# Patient Record
Sex: Male | Born: 1940 | Race: White | Hispanic: No | Marital: Married | Smoking: Never smoker
Health system: Southern US, Community
[De-identification: ages and names within clinical notes are randomized; demographics above are authoritative.]

## PROBLEM LIST (undated history)

## (undated) DIAGNOSIS — E7800 Pure hypercholesterolemia, unspecified: Secondary | ICD-10-CM

## (undated) DIAGNOSIS — I2699 Other pulmonary embolism without acute cor pulmonale: Secondary | ICD-10-CM

## (undated) DIAGNOSIS — C762 Malignant neoplasm of abdomen: Secondary | ICD-10-CM

## (undated) DIAGNOSIS — N529 Male erectile dysfunction, unspecified: Secondary | ICD-10-CM

## (undated) DIAGNOSIS — K8020 Calculus of gallbladder without cholecystitis without obstruction: Secondary | ICD-10-CM

## (undated) DIAGNOSIS — M109 Gout, unspecified: Secondary | ICD-10-CM

## (undated) DIAGNOSIS — F419 Anxiety disorder, unspecified: Secondary | ICD-10-CM

## (undated) DIAGNOSIS — Z808 Family history of malignant neoplasm of other organs or systems: Secondary | ICD-10-CM

## (undated) HISTORY — PX: ILEOSTOMY: SHX1783

---

## 2009-10-10 ENCOUNTER — Ambulatory Visit (HOSPITAL_COMMUNITY): Admission: RE | Admit: 2009-10-10 | Discharge: 2009-10-10 | Payer: Self-pay | Admitting: Endocrinology

## 2009-10-17 ENCOUNTER — Ambulatory Visit (HOSPITAL_COMMUNITY): Admission: RE | Admit: 2009-10-17 | Discharge: 2009-10-17 | Payer: Self-pay | Admitting: Endocrinology

## 2010-08-23 DIAGNOSIS — I2699 Other pulmonary embolism without acute cor pulmonale: Secondary | ICD-10-CM

## 2010-08-23 HISTORY — DX: Other pulmonary embolism without acute cor pulmonale: I26.99

## 2010-09-13 ENCOUNTER — Encounter: Payer: Self-pay | Admitting: Endocrinology

## 2010-11-12 LAB — CBC
HCT: 46.3 % (ref 39.0–52.0)
MCHC: 34.2 g/dL (ref 30.0–36.0)
Platelets: 201 10*3/uL (ref 150–400)
WBC: 6.3 10*3/uL (ref 4.0–10.5)

## 2012-11-26 ENCOUNTER — Encounter (HOSPITAL_COMMUNITY): Payer: Self-pay | Admitting: Physical Medicine and Rehabilitation

## 2012-11-26 ENCOUNTER — Emergency Department (HOSPITAL_COMMUNITY)
Admission: EM | Admit: 2012-11-26 | Discharge: 2012-11-26 | Disposition: A | Discharge status: Home / Self Care | Payer: Medicare Other | Attending: Emergency Medicine | Admitting: Emergency Medicine

## 2012-11-26 DIAGNOSIS — Z86711 Personal history of pulmonary embolism: Secondary | ICD-10-CM | POA: Insufficient documentation

## 2012-11-26 DIAGNOSIS — Z79899 Other long term (current) drug therapy: Secondary | ICD-10-CM | POA: Insufficient documentation

## 2012-11-26 DIAGNOSIS — F411 Generalized anxiety disorder: Secondary | ICD-10-CM | POA: Insufficient documentation

## 2012-11-26 DIAGNOSIS — C482 Malignant neoplasm of peritoneum, unspecified: Secondary | ICD-10-CM | POA: Insufficient documentation

## 2012-11-26 DIAGNOSIS — M109 Gout, unspecified: Secondary | ICD-10-CM | POA: Insufficient documentation

## 2012-11-26 DIAGNOSIS — H538 Other visual disturbances: Secondary | ICD-10-CM | POA: Insufficient documentation

## 2012-11-26 DIAGNOSIS — R61 Generalized hyperhidrosis: Secondary | ICD-10-CM | POA: Insufficient documentation

## 2012-11-26 DIAGNOSIS — IMO0002 Reserved for concepts with insufficient information to code with codable children: Secondary | ICD-10-CM | POA: Insufficient documentation

## 2012-11-26 DIAGNOSIS — E7800 Pure hypercholesterolemia, unspecified: Secondary | ICD-10-CM | POA: Insufficient documentation

## 2012-11-26 DIAGNOSIS — Z9221 Personal history of antineoplastic chemotherapy: Secondary | ICD-10-CM | POA: Insufficient documentation

## 2012-11-26 DIAGNOSIS — Z87448 Personal history of other diseases of urinary system: Secondary | ICD-10-CM | POA: Insufficient documentation

## 2012-11-26 DIAGNOSIS — R42 Dizziness and giddiness: Secondary | ICD-10-CM

## 2012-11-26 DIAGNOSIS — Z7901 Long term (current) use of anticoagulants: Secondary | ICD-10-CM | POA: Insufficient documentation

## 2012-11-26 HISTORY — DX: Other pulmonary embolism without acute cor pulmonale: I26.99

## 2012-11-26 HISTORY — DX: Anxiety disorder, unspecified: F41.9

## 2012-11-26 HISTORY — DX: Gout, unspecified: M10.9

## 2012-11-26 HISTORY — DX: Male erectile dysfunction, unspecified: N52.9

## 2012-11-26 HISTORY — DX: Malignant neoplasm of abdomen: C76.2

## 2012-11-26 HISTORY — DX: Pure hypercholesterolemia, unspecified: E78.00

## 2012-11-26 LAB — CBC
MCH: 31.7 pg (ref 26.0–34.0)
MCHC: 34.6 g/dL (ref 30.0–36.0)
MCV: 91.6 fL (ref 78.0–100.0)
RBC: 4.54 MIL/uL (ref 4.22–5.81)
RDW: 15.8 % — ABNORMAL HIGH (ref 11.5–15.5)

## 2012-11-26 LAB — BASIC METABOLIC PANEL
BUN: 24 mg/dL — ABNORMAL HIGH (ref 6–23)
Calcium: 9.5 mg/dL (ref 8.4–10.5)
GFR calc Af Amer: 76 mL/min — ABNORMAL LOW (ref >90)
Glucose, Bld: 103 mg/dL — ABNORMAL HIGH (ref 70–99)
Sodium: 138 mEq/L (ref 135–145)

## 2012-11-26 LAB — GLUCOSE, CAPILLARY: Glucose-Capillary: 95 mg/dL (ref 70–99)

## 2012-11-26 LAB — PROTIME-INR: Prothrombin Time: 24.7 seconds — ABNORMAL HIGH (ref 11.6–15.2)

## 2012-11-26 MED ORDER — HEPARIN SOD (PORK) LOCK FLUSH 100 UNIT/ML IV SOLN
500.0000 [IU] | INTRAVENOUS | Status: DC
  Filled 2012-11-26: qty 5

## 2012-11-26 MED ORDER — SODIUM CHLORIDE 0.9 % IJ SOLN: 10.0000 mL | Freq: Two times a day (BID) | INTRAMUSCULAR | Status: DC

## 2012-11-26 MED ORDER — HEPARIN SOD (PORK) LOCK FLUSH 100 UNIT/ML IV SOLN
500.0000 [IU] | INTRAVENOUS | Status: DC | PRN
  Administered 2012-11-26: 500 [IU]
  Filled 2012-11-26: qty 5

## 2012-11-26 MED ORDER — DIAZEPAM 5 MG PO TABS: 5.0000 mg | ORAL_TABLET | Freq: Four times a day (QID) | ORAL | Status: AC | PRN

## 2012-11-26 MED ORDER — ONDANSETRON HCL 4 MG/2ML IJ SOLN
INTRAMUSCULAR | Status: AC
  Filled 2012-11-26: qty 2

## 2012-11-26 MED ORDER — ONDANSETRON HCL 4 MG/2ML IJ SOLN
4.0000 mg | Freq: Once | INTRAMUSCULAR | Status: AC
  Administered 2012-11-26: 4 mg via INTRAVENOUS

## 2012-11-26 MED ORDER — MECLIZINE HCL 25 MG PO TABS
25.0000 mg | ORAL_TABLET | Freq: Once | ORAL | Status: AC
  Administered 2012-11-26: 25 mg via ORAL
  Filled 2012-11-26: qty 1

## 2012-11-26 MED ORDER — MECLIZINE HCL 25 MG PO TABS: 25.0000 mg | ORAL_TABLET | Freq: Three times a day (TID) | ORAL | Status: AC | PRN

## 2012-11-26 MED ORDER — LORAZEPAM 2 MG/ML IJ SOLN
1.0000 mg | Freq: Once | INTRAMUSCULAR | Status: AC
  Administered 2012-11-26: 1 mg via INTRAVENOUS

## 2012-11-26 MED ORDER — SODIUM CHLORIDE 0.9 % IJ SOLN
10.0000 mL | INTRAMUSCULAR | Status: DC | PRN
  Administered 2012-11-26: 10 mL

## 2012-11-26 MED ORDER — ONDANSETRON 8 MG PO TBDP: 8.0000 mg | ORAL_TABLET | Freq: Three times a day (TID) | ORAL | Status: DC | PRN

## 2012-11-26 MED ORDER — LORAZEPAM 2 MG/ML IJ SOLN
1.0000 mg | Freq: Once | INTRAMUSCULAR | Status: AC
  Administered 2012-11-26: 1 mg via INTRAVENOUS
  Filled 2012-11-26: qty 1

## 2012-11-26 MED ORDER — LORAZEPAM 2 MG/ML IJ SOLN
INTRAMUSCULAR | Status: AC
  Filled 2012-11-26: qty 1

## 2012-11-26 MED ORDER — SODIUM CHLORIDE 0.9 % IV BOLUS (SEPSIS)
1000.0000 mL | Freq: Once | INTRAVENOUS | Status: AC
  Administered 2012-11-26: 1000 mL via INTRAVENOUS

## 2012-11-26 NOTE — ED Notes (Signed)
Pt still c/o dizziness upon standing

## 2012-11-26 NOTE — ED Notes (Signed)
IV team at bedside 

## 2012-11-26 NOTE — ED Notes (Signed)
Pt presents to department for evaluation of sudden onset of dizziness while at church this morning. Also states blurred vision and diaphoresis. Denies LOC. Denies chest pain. Respirations unlabored. Pt is conscious alert and oriented x4. No neurological deficits noted.

## 2012-11-26 NOTE — ED Provider Notes (Signed)
History     CSN: [id number]  Arrival date & time 11/26/12  1116   First MD Initiated Contact with Patient 11/26/12 1122      Chief Complaint  Patient presents with  . Dizziness  . Blurred Vision  . Excessive Sweating     The history is provided by the patient.   patient reports a sensation of acute onset dizziness which she describes as a movement sensation in the room in front of him.  He also had some bilateral blurred vision.  No headaches.  He states in his normal health over the past several days including no nausea vomiting or diarrhea.  No fevers or chills.  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  He's been tolerating chemotherapy well over the past 12-18 months.  He states he rarely has any symptoms at all from his chemotherapy.  His last treatment was 5 days ago.  He was on his way to church and was walking in the church when his symptoms began.  He had a normal morning.  No recent falls or trauma.  No headache.  No weakness of his upper lower extremities.  He states he feels much better now is lying down and lying flat and not moving.  He is on Coumadin.  Past Medical History  Diagnosis Date  . Cancer of abdomen     No past surgical history on file.  History reviewed. No pertinent family history.  History  Substance Use Topics  . Smoking status: Never Smoker   . Smokeless tobacco: Not on file  . Alcohol Use: No      Review of Systems  All other systems reviewed and are negative.    Allergies  Iohexol  Home Medications   Current Outpatient Rx  Name  Route  Sig  Dispense  Refill  . allopurinol (ZYLOPRIM) 100 MG tablet   Oral   Take 100 mg by mouth daily.         Marland Kitchen ALPRAZolam (XANAX) 0.5 MG tablet   Oral   Take 0.5 mg by mouth daily as needed for sleep or anxiety.         Marland Kitchen atorvastatin (LIPITOR) 40 MG tablet   Oral   Take 40 mg by mouth daily.         . diphenhydrAMINE  (BENADRYL) 50 MG capsule   Oral   Take 50 mg by mouth daily as needed for itching.         . diphenoxylate-atropine (LOMOTIL) 2.5-0.025 MG per tablet   Oral   Take 1-2 tablets by mouth every 6 (six) hours as needed for diarrhea or loose stools.         . fenofibrate (TRICOR) 145 MG tablet   Oral   Take 145 mg by mouth daily.         Marland Kitchen lidocaine-prilocaine (EMLA) cream   Topical   Apply 1 application topically every 14 (fourteen) days. Every other Tues         . loperamide (IMODIUM) 2 MG capsule   Oral   Take 2 mg by mouth 4 (four) times daily as needed for diarrhea or loose stools.         . predniSONE (DELTASONE) 50 MG tablet   Oral   Take 50-150 mg by mouth daily. Takes 3 tabs 13 hrs, 7 hrs, then 1 tab 1 hr prior to ct scan         . warfarin (  COUMADIN) 1 MG tablet   Oral   Take 1-2 mg by mouth every evening. Sundays only-2 tab (2 mg total); All other days 1 tab (1 mg total)           BP 146/102  Pulse 70  Temp(Src) 97.4 F (36.3 C) (Oral)  Resp 15  SpO2 99%  Physical Exam  Nursing note and vitals reviewed. Constitutional: He is oriented to person, place, and time. He appears well-developed and well-nourished.  HENT:  Head: Normocephalic and atraumatic.  Eyes: EOM are normal. Pupils are equal, round, and reactive to light.  Neck: Normal range of motion.  Cardiovascular: Normal rate, regular rhythm, normal heart sounds and intact distal pulses.   Pulmonary/Chest: Effort normal and breath sounds normal. No respiratory distress.  Port in right chest without secondary signs of infection surrounding it.  No tenderness   Abdominal: Soft. He exhibits no distension. There is no tenderness.  Ileostomy with normal brown stool output.  Musculoskeletal: Normal range of motion.  Neurological: He is alert and oriented to person, place, and time.  5/5 strength in major muscle groups of  bilateral upper and lower extremities. Speech normal. No facial asymetry.    Skin: Skin is warm and dry.  Psychiatric: He has a normal mood and affect. Judgment normal.    ED Course  Procedures (including critical care time)   Date: 11/26/2012  Rate: 62  Rhythm: normal sinus rhythm  QRS Axis: normal  Intervals: normal  ST/T Wave abnormalities: Nonspecific ST changes  Conduction Disutrbances: none  Narrative Interpretation:   Old EKG Reviewed: No significant changes noted     Labs Reviewed  CBC - Abnormal; Notable for the following:    RDW 15.8 (*)    Platelets 132 (*)    All other components within normal limits  BASIC METABOLIC PANEL - Abnormal; Notable for the following:    Glucose, Bld 103 (*)    BUN 24 (*)    GFR calc non Af Amer 66 (*)    GFR calc Af Amer 76 (*)    All other components within normal limits  PROTIME-INR - Abnormal; Notable for the following:    Prothrombin Time 24.7 (*)    INR 2.35 (*)    All other components within normal limits  TROPONIN I  GLUCOSE, CAPILLARY   No results found.   1. Vertigo       MDM  Suspect peripheral vertigo.  Normal neurologic exam.  Symptoms improving drastically at this time.  Doubt stroke.  Basic labs and fluids to be given.  Orthostatic vital signs are normal.  We'll continue to monitor in the emergency apartment.  No indication for advanced imaging at this time.  3:11 PM Date advancement meclizine was given when the patient tried to sit up beside the bed and developed lateral nystagmus and vertiginous symptoms.  After Ativan and meclizine the patient feels much better.  He can sit comfortably up beside the bed.  This seems to be peripheral vertigo issue.  PCP followup.  Instructions return to the ER for new or worsening symptoms.  Doubt central process.        Lyanne Co, MD 11/26/12 [phone]

## 2012-11-26 NOTE — ED Notes (Signed)
Pt sts he was walking into church when he became really dizzy and then felt like he was going to pass out but was able to sit down and started to have a little blurred vision and felt like the room was spinning. Pt sts after 2 mins of sitting down he felt much better. Now pt sts he is still feeling dizzy. Pt in nad, skin warm and dry, resp e/u. Pt's last chemo tx was on Tuesday with a pump that lasted til Thursday, reports he has had this done many times and no issues with this last round. Pt denies any new medications.

## 2012-11-26 NOTE — ED Notes (Signed)
Paged IV team to de-access port.

## 2012-11-26 NOTE — ED Notes (Signed)
Attempted to ambulate pt. Pt states dizziness while setting on edge of bed. Pt placed back in bed Dr.Campos informed

## 2013-02-21 ENCOUNTER — Emergency Department (HOSPITAL_COMMUNITY)
Admission: EM | Admit: 2013-02-21 | Discharge: 2013-02-21 | Disposition: A | Discharge status: Home / Self Care | Payer: Medicare Other | Attending: Emergency Medicine | Admitting: Emergency Medicine

## 2013-02-21 ENCOUNTER — Emergency Department (HOSPITAL_COMMUNITY): Payer: Medicare Other

## 2013-02-21 ENCOUNTER — Encounter (HOSPITAL_COMMUNITY): Payer: Self-pay | Admitting: Emergency Medicine

## 2013-02-21 DIAGNOSIS — M109 Gout, unspecified: Secondary | ICD-10-CM | POA: Insufficient documentation

## 2013-02-21 DIAGNOSIS — R109 Unspecified abdominal pain: Secondary | ICD-10-CM | POA: Insufficient documentation

## 2013-02-21 DIAGNOSIS — R1110 Vomiting, unspecified: Secondary | ICD-10-CM | POA: Insufficient documentation

## 2013-02-21 DIAGNOSIS — Z7901 Long term (current) use of anticoagulants: Secondary | ICD-10-CM | POA: Insufficient documentation

## 2013-02-21 DIAGNOSIS — Z86711 Personal history of pulmonary embolism: Secondary | ICD-10-CM | POA: Insufficient documentation

## 2013-02-21 DIAGNOSIS — Z8639 Personal history of other endocrine, nutritional and metabolic disease: Secondary | ICD-10-CM | POA: Insufficient documentation

## 2013-02-21 DIAGNOSIS — Z8719 Personal history of other diseases of the digestive system: Secondary | ICD-10-CM | POA: Insufficient documentation

## 2013-02-21 DIAGNOSIS — Z8509 Personal history of malignant neoplasm of other digestive organs: Secondary | ICD-10-CM | POA: Insufficient documentation

## 2013-02-21 DIAGNOSIS — F411 Generalized anxiety disorder: Secondary | ICD-10-CM | POA: Insufficient documentation

## 2013-02-21 DIAGNOSIS — IMO0002 Reserved for concepts with insufficient information to code with codable children: Secondary | ICD-10-CM | POA: Insufficient documentation

## 2013-02-21 DIAGNOSIS — Z87448 Personal history of other diseases of urinary system: Secondary | ICD-10-CM | POA: Insufficient documentation

## 2013-02-21 DIAGNOSIS — Z862 Personal history of diseases of the blood and blood-forming organs and certain disorders involving the immune mechanism: Secondary | ICD-10-CM | POA: Insufficient documentation

## 2013-02-21 DIAGNOSIS — Z79899 Other long term (current) drug therapy: Secondary | ICD-10-CM | POA: Insufficient documentation

## 2013-02-21 HISTORY — DX: Calculus of gallbladder without cholecystitis without obstruction: K80.20

## 2013-02-21 HISTORY — DX: Family history of malignant neoplasm of other organs or systems: Z80.8

## 2013-02-21 LAB — CBC WITH DIFFERENTIAL/PLATELET
Basophils Absolute: 0 10*3/uL (ref 0.0–0.1)
Eosinophils Relative: 1 % (ref 0–5)
HCT: 52 % (ref 39.0–52.0)
Hemoglobin: 17.2 g/dL — ABNORMAL HIGH (ref 13.0–17.0)
Lymphocytes Relative: 18 % (ref 12–46)
Lymphs Abs: 1.8 10*3/uL (ref 0.7–4.0)
MCV: 98.1 fL (ref 78.0–100.0)
Monocytes Absolute: 0.9 10*3/uL (ref 0.1–1.0)
Monocytes Relative: 9 % (ref 3–12)
RDW: 15.6 % — ABNORMAL HIGH (ref 11.5–15.5)
WBC: 10.3 10*3/uL (ref 4.0–10.5)

## 2013-02-21 LAB — URINALYSIS, ROUTINE W REFLEX MICROSCOPIC
Ketones, ur: 15 mg/dL — AB
Leukocytes, UA: NEGATIVE
Protein, ur: 30 mg/dL — AB
Urobilinogen, UA: 0.2 mg/dL (ref 0.0–1.0)

## 2013-02-21 LAB — LIPASE, BLOOD: Lipase: 43 U/L (ref 11–59)

## 2013-02-21 LAB — COMPREHENSIVE METABOLIC PANEL
BUN: 18 mg/dL (ref 6–23)
CO2: 22 mEq/L (ref 19–32)
Calcium: 9.9 mg/dL (ref 8.4–10.5)
Creatinine, Ser: 1.17 mg/dL (ref 0.50–1.35)
GFR calc Af Amer: 71 mL/min — ABNORMAL LOW (ref >90)
GFR calc non Af Amer: 61 mL/min — ABNORMAL LOW (ref >90)
Glucose, Bld: 118 mg/dL — ABNORMAL HIGH (ref 70–99)
Total Bilirubin: 0.7 mg/dL (ref 0.3–1.2)

## 2013-02-21 LAB — URINE MICROSCOPIC-ADD ON

## 2013-02-21 MED ORDER — FENTANYL CITRATE 0.05 MG/ML IJ SOLN
50.0000 ug | Freq: Once | INTRAMUSCULAR | Status: AC
  Administered 2013-02-21: 50 ug via INTRAVENOUS
  Filled 2013-02-21: qty 2

## 2013-02-21 MED ORDER — OXYCODONE-ACETAMINOPHEN 5-325 MG PO TABS: 1.0000 | ORAL_TABLET | Freq: Four times a day (QID) | ORAL | Status: AC | PRN

## 2013-02-21 MED ORDER — SODIUM CHLORIDE 0.9 % IV BOLUS (SEPSIS)
1000.0000 mL | Freq: Once | INTRAVENOUS | Status: AC
  Administered 2013-02-21: 1000 mL via INTRAVENOUS

## 2013-02-21 MED ORDER — ONDANSETRON HCL 4 MG/2ML IJ SOLN
4.0000 mg | Freq: Once | INTRAMUSCULAR | Status: AC
  Administered 2013-02-21: 4 mg via INTRAVENOUS
  Filled 2013-02-21: qty 2

## 2013-02-21 NOTE — ED Provider Notes (Addendum)
History    CSN: [id number] Arrival date & time 02/21/13  2952  First MD Initiated Contact with Patient 02/21/13 [phone]     Chief Complaint  Patient presents with  . Abdominal Pain   (Consider location/radiation/quality/duration/timing/severity/associated sxs/prior Treatment) HPI Comments: Pt states a few months ago had something similar and it just went away but today having vomiting and not going away.  Patient is a 72 y.o. male presenting with abdominal pain. The history is provided by the patient.  Abdominal Pain This is a new (around 2am developed dull aching left sided abd pain that worsened.) problem. The current episode started 3 to 5 hours ago. The problem occurs constantly. The problem has not changed since onset.Associated symptoms include abdominal pain. Pertinent negatives include no chest pain. Associated symptoms comments: vomiting. Nothing aggravates the symptoms. Nothing relieves the symptoms. He has tried nothing for the symptoms. The treatment provided no relief.   Past Medical History  Diagnosis Date  . Cancer of abdomen   . Gout   . Anxiety   . PE (pulmonary embolism) 2012  . ED (erectile dysfunction)   . Hypercholesteremia   . Cholelithiasis   . Family history of peritoneal cancer    Past Surgical History  Procedure Laterality Date  . Ileostomy     No family history on file. History  Substance Use Topics  . Smoking status: Never Smoker   . Smokeless tobacco: Not on file  . Alcohol Use: No    Review of Systems  Constitutional: Negative for fever and chills.  Cardiovascular: Negative for chest pain.  Gastrointestinal: Positive for abdominal pain.       Still having good ostomy output  All other systems reviewed and are negative.    Allergies  Iohexol  Home Medications   Current Outpatient Rx  Name  Route  Sig  Dispense  Refill  . allopurinol (ZYLOPRIM) 100 MG tablet   Oral   Take 100 mg by mouth daily.         Marland Kitchen ALPRAZolam (XANAX) 0.5  MG tablet   Oral   Take 0.5 mg by mouth daily as needed for sleep or anxiety.         Marland Kitchen atorvastatin (LIPITOR) 40 MG tablet   Oral   Take 40 mg by mouth daily.         . Bevacizumab (AVASTIN IV)   Intravenous   Inject 1 each into the vein every 14 (fourteen) days. Gets prior to chemo, Dose is determined by bloodwork done on the day of chemo         . diazepam (VALIUM) 5 MG tablet   Oral   Take 1 tablet (5 mg total) by mouth every 6 (six) hours as needed (dizziness).   10 tablet   0   . diphenhydrAMINE (BENADRYL) 25 MG tablet   Oral   Take 50 mg by mouth as needed. Take 1 hour prior to CT Scan         . diphenoxylate-atropine (LOMOTIL) 2.5-0.025 MG per tablet   Oral   Take 1-2 tablets by mouth every 6 (six) hours as needed for diarrhea or loose stools.         . fenofibrate (TRICOR) 145 MG tablet   Oral   Take 145 mg by mouth daily.         . fluorouracil (ADRUCIL) 500 MG/10ML SOLN   Intravenous   Inject 5,015 mg into the vein every 14 (fourteen) days. Chemo Infustion in 0.9%  Sodium Chloride Infuse over 46 hours         . lidocaine-prilocaine (EMLA) cream   Topical   Apply 1 application topically as needed. Apply to infusion site 1 hour prior to chemo         . loperamide (IMODIUM) 2 MG capsule   Oral   Take 2 mg by mouth 4 (four) times daily as needed for diarrhea or loose stools.         . predniSONE (DELTASONE) 50 MG tablet   Oral   Take 50 mg by mouth daily.         Marland Kitchen warfarin (COUMADIN) 1 MG tablet   Oral   Take 1-2 mg by mouth every evening. Sundays only-2 tab (2 mg total); All other days 1 tab (1 mg total)         . meclizine (ANTIVERT) 25 MG tablet   Oral   Take 1 tablet (25 mg total) by mouth 3 (three) times daily as needed for dizziness.   15 tablet   0   . predniSONE (DELTASONE) 50 MG tablet   Oral   Take 50 mg by mouth as needed. Takes 1 tab at 13 hrs, takes 1 tab at 7 hrs, then 1 tab 1 hr prior to ct scan          BP  148/85  Pulse 91  Temp(Src) 99.6 F (37.6 C) (Oral)  Resp 15  SpO2 98% Physical Exam  Nursing note and vitals reviewed. Constitutional: He is oriented to person, place, and time. He appears well-developed and well-nourished. No distress.  HENT:  Head: Normocephalic and atraumatic.  Mouth/Throat: Oropharynx is clear and moist.  Eyes: Conjunctivae and EOM are normal. Pupils are equal, round, and reactive to light.  Neck: Normal range of motion. Neck supple.  Cardiovascular: Normal rate, regular rhythm and intact distal pulses.   No murmur heard. Pulmonary/Chest: Effort normal and breath sounds normal. No respiratory distress. He has no wheezes. He has no rales.  Abdominal: Soft. He exhibits no distension. Bowel sounds are decreased. There is splenomegaly. There is tenderness in the left upper quadrant and left lower quadrant. There is no rebound, no guarding and no CVA tenderness.    Musculoskeletal: Normal range of motion. He exhibits no edema and no tenderness.  Neurological: He is alert and oriented to person, place, and time.  Skin: Skin is warm and dry. No rash noted. No erythema.  Psychiatric: He has a normal mood and affect. His behavior is normal.    ED Course  Procedures (including critical care time) Labs Reviewed  CBC WITH DIFFERENTIAL - Abnormal; Notable for the following:    Hemoglobin 17.2 (*)    RDW 15.6 (*)    All other components within normal limits  COMPREHENSIVE METABOLIC PANEL - Abnormal; Notable for the following:    Glucose, Bld 118 (*)    GFR calc non Af Amer 61 (*)    GFR calc Af Amer 71 (*)    All other components within normal limits  LIPASE, BLOOD  URINALYSIS, ROUTINE W REFLEX MICROSCOPIC   Ct Abdomen Pelvis Wo Contrast  02/21/2013   *RADIOLOGY REPORT*  Clinical Data: Left lower quadrant pain.  Nausea and vomiting. Peritoneal carcinoma.  CT ABDOMEN AND PELVIS WITHOUT CONTRAST  Technique:  Multidetector CT imaging of the abdomen and pelvis was  performed following the standard protocol without intravenous contrast.  Comparison: None.  Findings: A small left pleural effusion and tiny right pleural effusion are noted.  There is also mild ascites within the abdomen pelvis.  There is soft tissue stranding is seen throughout the omental fat, consistent with history of peritoneal carcinoma.  Right lower quadrant ileostomy is seen.  Oral contrast is seen passing through the small bowel to the ileostomy, without evidence of bowel obstruction.  Noncontrast images of the liver, spleen, pancreas, adrenal glands, and kidneys are unremarkable in appearance.  No evidence of hydronephrosis.  A small soft tissue nodules or mild lymphadenopathy are seen in the right lower quadrant anterior to the psoas muscle, with largest nodule measuring 1.2 cm.  This is suspicious for peritoneal tumor implants or metastatic lymphadenopathy.  No other soft tissue masses or sites of lymphadenopathy identified. A tiny calcified gallstone is noted, however there is no evidence of cholecystitis.  IMPRESSION:  1.  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 2.  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 3.  Right lower quadrant ileostomy.  No definite evidence of small bowel obstruction. 4.  Cholelithiasis.  No radiographic evidence of cholecystitis.   Original Report Authenticated By: Myles Rosenthal, M.D.   Dg Abd Acute W/chest  02/21/2013   *RADIOLOGY REPORT*  Clinical Data: Evaluate for small bowel obstruction.  Nausea and vomiting.  ACUTE ABDOMEN SERIES (ABDOMEN 2 VIEW & CHEST 1 VIEW)  Comparison: None  Findings: There is a right chest wall porta-catheter with tip in the cavoatrial junction.  The heart size appears normal.  No pleural effusion or edema.  There is no airspace consolidation identified.  There is a single dilated loop of bowel within the  central abdomen measuring up to 3 cm.  Multiple air-fluid levels noted on the upright images.  Colonic gas and stool noted.  IMPRESSION:  1.  Nonspecific bowel gas pattern.  Cannot rule out partial bowel obstruction.   Original Report Authenticated By: Signa Kell, M.D.   1. Abdominal  pain, other specified site     MDM   Patient presenting with worsening abdominal pain to the left side of his abdomen that said around 2 AM coughing and vomiting. She denies fevers and chills and still has good output to his ostomy bag. Patient has a significant history of abdominal cancer currently getting chemotherapy, PE and on Coumadin therapy.  After pain medication patient appears more comfortable but is still having left-sided abdominal pain. He also has a history of kidney stones. Suspicion for aortic dissection or AAA rupture as well as patient recently had a CT scan in June did not show any pathology suggestive of this.  Concern for KS vs SBO as pt on recent CT showed abd ascites, tumors and adhesions. Labs wnl other than hemoconcentration.  UA pending.  AAS showed possible partial SBO.  1:15 PM Ct without signs of SBO.  Pt states pain improved and tolerating po's.  Will d/ch ome.  Gwyneth Sprout, MD 02/21/13 1315  Gwyneth Sprout, MD 02/21/13 1316

## 2013-02-21 NOTE — ED Notes (Signed)
PT. REPORTS SUDDEN ONSET LEFT ABDOMINAL PAIN ONSET 2 AM THIS MORNING WITH VOMITTING , DENIES FEVER OR CHILLS.

## 2013-02-21 NOTE — ED Notes (Signed)
Pt discharged.Vital signs stable and GCS 15 

## 2013-02-21 NOTE — ED Notes (Signed)
Left abdominal pain started at 0200, vomited twice and one while in Ed, pt has RLQ colostomy. Pt was seen as [facility] on 02/08/13. Hx of peritoneal ca since march 11 th 2010.

## 2013-11-18 ENCOUNTER — Other Ambulatory Visit: Payer: Self-pay | Admitting: Endocrinology

## 2013-11-18 ENCOUNTER — Inpatient Hospital Stay (HOSPITAL_COMMUNITY): Payer: Medicare Other

## 2013-11-18 ENCOUNTER — Inpatient Hospital Stay (HOSPITAL_COMMUNITY)
Admission: AD | Admit: 2013-11-18 | Discharge: 2013-11-21 | DRG: 871 | Disposition: E | Payer: Medicare Other | Source: Ambulatory Visit | Attending: Endocrinology | Admitting: Endocrinology

## 2013-11-18 DIAGNOSIS — R627 Adult failure to thrive: Secondary | ICD-10-CM | POA: Diagnosis present

## 2013-11-18 DIAGNOSIS — J90 Pleural effusion, not elsewhere classified: Secondary | ICD-10-CM | POA: Diagnosis present

## 2013-11-18 DIAGNOSIS — N189 Chronic kidney disease, unspecified: Secondary | ICD-10-CM | POA: Diagnosis present

## 2013-11-18 DIAGNOSIS — R6520 Severe sepsis without septic shock: Secondary | ICD-10-CM

## 2013-11-18 DIAGNOSIS — J9600 Acute respiratory failure, unspecified whether with hypoxia or hypercapnia: Secondary | ICD-10-CM | POA: Diagnosis present

## 2013-11-18 DIAGNOSIS — N179 Acute kidney failure, unspecified: Secondary | ICD-10-CM | POA: Diagnosis present

## 2013-11-18 DIAGNOSIS — A419 Sepsis, unspecified organism: Principal | ICD-10-CM | POA: Diagnosis present

## 2013-11-18 DIAGNOSIS — R634 Abnormal weight loss: Secondary | ICD-10-CM | POA: Diagnosis present

## 2013-11-18 DIAGNOSIS — R791 Abnormal coagulation profile: Secondary | ICD-10-CM | POA: Diagnosis present

## 2013-11-18 DIAGNOSIS — R945 Abnormal results of liver function studies: Secondary | ICD-10-CM | POA: Diagnosis present

## 2013-11-18 DIAGNOSIS — Z9221 Personal history of antineoplastic chemotherapy: Secondary | ICD-10-CM

## 2013-11-18 DIAGNOSIS — C181 Malignant neoplasm of appendix: Secondary | ICD-10-CM | POA: Diagnosis present

## 2013-11-18 LAB — CBC
HCT: 42.2 % (ref 39.0–52.0)
HEMOGLOBIN: 14.4 g/dL (ref 13.0–17.0)
MCH: 31.8 pg (ref 26.0–34.0)
MCHC: 34.1 g/dL (ref 30.0–36.0)
MCV: 93.2 fL (ref 78.0–100.0)
Platelets: 233 10*3/uL (ref 150–400)
RBC: 4.53 MIL/uL (ref 4.22–5.81)
RDW: 17.7 % — ABNORMAL HIGH (ref 11.5–15.5)
WBC: 17 10*3/uL — ABNORMAL HIGH (ref 4.0–10.5)

## 2013-11-18 MED ORDER — ACETAMINOPHEN 650 MG RE SUPP: 650.0000 mg | Freq: Four times a day (QID) | RECTAL | Status: DC | PRN

## 2013-11-18 MED ORDER — SODIUM CHLORIDE 0.9 % IV SOLN
INTRAVENOUS | Status: DC
Start: 2013-11-18 — End: 2013-11-19
  Administered 2013-11-18: 22:00:00 via INTRAVENOUS

## 2013-11-18 MED ORDER — PIPERACILLIN-TAZOBACTAM 3.375 G IVPB 30 MIN
3.3750 g | Freq: Once | INTRAVENOUS | Status: AC
  Administered 2013-11-18: 3.375 g via INTRAVENOUS
  Filled 2013-11-18: qty 50

## 2013-11-18 MED ORDER — ACETAMINOPHEN 325 MG PO TABS: 650.0000 mg | ORAL_TABLET | Freq: Four times a day (QID) | ORAL | Status: DC | PRN

## 2013-11-18 MED ORDER — MORPHINE SULFATE 2 MG/ML IJ SOLN
1.0000 mg | INTRAMUSCULAR | Status: DC | PRN
  Administered 2013-11-18 – 2013-11-19 (×3): 1 mg via INTRAVENOUS
  Filled 2013-11-18 (×3): qty 1

## 2013-11-18 NOTE — H&P (Signed)
PCP:   Sheela Stack, MD   Chief Complaint:  4 YOWM with hx progressive appendiceal CA with carcinomatosis, pleural effusion, ileostomy. Ill this past week. Had CT late in the week with progressive CA despite chemo. Recent pneumonia and pleural effusions. Now chills, sweats and delirium at home. Drinking but not eating. Ileostomy working. Refused hospitalization. Made a home visit and convinced him to be admitted.  Has lost 40+ pounds and in a FTT phase. Seen this week in our office INR has been difficult to control and was very high recently  HPI: See above  Review of Systems:  Review of Systems -  Past Medical History: Past Medical History  Diagnosis Date  . Cancer of abdomen   . Gout   . Anxiety   . PE (pulmonary embolism) 2012  . ED (erectile dysfunction)   . Hypercholesteremia   . Cholelithiasis   . Family history of peritoneal cancer    Past Surgical History  Procedure Laterality Date  . Ileostomy      Medications: Prior to Admission medications   Medication Sig Start Date End Date Taking? Authorizing Provider  allopurinol (ZYLOPRIM) 100 MG tablet Take 100 mg by mouth daily.    Historical Provider, MD  ALPRAZolam Duanne Moron) 0.5 MG tablet Take 0.5 mg by mouth daily as needed for sleep or anxiety.    Historical Provider, MD  atorvastatin (LIPITOR) 40 MG tablet Take 40 mg by mouth daily.    Historical Provider, MD  Bevacizumab (AVASTIN IV) Inject 1 each into the vein every 14 (fourteen) days. Gets prior to chemo, Dose is determined by bloodwork done on the day of chemo    Historical Provider, MD  diazepam (VALIUM) 5 MG tablet Take 1 tablet (5 mg total) by mouth every 6 (six) hours as needed (dizziness). 11/26/12   Hoy Morn, MD  diphenhydrAMINE (BENADRYL) 25 MG tablet Take 50 mg by mouth as needed. Take 1 hour prior to CT Scan    Historical Provider, MD  diphenoxylate-atropine (LOMOTIL) 2.5-0.025 MG per tablet Take 1-2 tablets by mouth every 6 (six) hours as needed  for diarrhea or loose stools.    Historical Provider, MD  fenofibrate (TRICOR) 145 MG tablet Take 145 mg by mouth daily.    Historical Provider, MD  fluorouracil (ADRUCIL) 500 MG/10ML SOLN Inject 5,015 mg into the vein every 14 (fourteen) days. Chemo Infustion in 0.9% Sodium Chloride Infuse over 46 hours    Historical Provider, MD  lidocaine-prilocaine (EMLA) cream Apply 1 application topically as needed. Apply to infusion site 1 hour prior to chemo    Historical Provider, MD  loperamide (IMODIUM) 2 MG capsule Take 2 mg by mouth 4 (four) times daily as needed for diarrhea or loose stools.    Historical Provider, MD  meclizine (ANTIVERT) 25 MG tablet Take 1 tablet (25 mg total) by mouth 3 (three) times daily as needed for dizziness. 11/26/12   Hoy Morn, MD  oxyCODONE-acetaminophen (PERCOCET/ROXICET) 5-325 MG per tablet Take 1-2 tablets by mouth every 6 (six) hours as needed for pain. 02/21/13   Blanchie Dessert, MD  predniSONE (DELTASONE) 50 MG tablet Take 50 mg by mouth as needed. Takes 1 tab at 13 hrs, takes 1 tab at 7 hrs, then 1 tab 1 hr prior to ct scan    Historical Provider, MD  predniSONE (DELTASONE) 50 MG tablet Take 50 mg by mouth daily.    Historical Provider, MD  warfarin (COUMADIN) 1 MG tablet Take 1-2 mg by mouth every  evening. Sundays only-2 tab (2 mg total); All other days 1 tab (1 mg total)    Historical Provider, MD    Allergies:   Allergies  Allergen Reactions  . Iohexol      Code: HIVES, Desc: PT STATES HIVES AFTER CONTRAST IN 1976     Social History:  reports that he has never smoked. He does not have any smokeless tobacco history on file. He reports that he does not drink alcohol or use illicit drugs.  Family History: No family history on file.  Physical Exam: Pulse 75 and irreg. Thready at radial pulse General appearance: bitemporal wasting. Dry oral membranes, a bit of jaundice.   l Neck: no adenopathy, no carotid bruit, no JVD and thyroid not enlarged,  symmetric, no tenderness/mass/nodules Resp: clear in upper 1/2 some dullness at bases, no accessory ms in use Cardio: irreg with murmur GI: ileostomy bag distended. Min bowel sounds Extremities: 1+ edema extremities normal, atraumatic,  Neuro: confused, somnolent. Will awaken. Slurred speech, very weak Labs on Admission:   ASSESSMENT/PLAN  SEPSIS: Suspect GI source. Cover with zosyn RENAL FAILURE: not eating and drinking. 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 May be ready for hospice HX PULMONARY EMBOLI: on chronic coumadin. Suspect INR will be high PLEURAL EFFUSIONS: Worse this week DNR, quite ill and may not survive. Has progressive CA and metabolic issues     :     Sheela Stack 10/25/2013, 7:56 PM

## 2013-11-18 NOTE — Progress Notes (Signed)
Lab unable to access vein.  IV team paged to access R chest port.

## 2013-11-18 NOTE — Progress Notes (Addendum)
ANTIBIOTIC CONSULT NOTE - INITIAL  Pharmacy Consult for Zosyn Indication: r/o sepsis of abdominal source  Allergies  Allergen Reactions  . Iohexol      Code: HIVES, Desc: PT [place]     Patient Measurements: Height: 5\' 10"  (177.8 cm) Weight: 151 lb 4.8 oz (68.629 kg) IBW/kg (Calculated) : 73  Vital Signs:   Intake/Output from previous day:   Intake/Output from this shift:    Labs: No results found for this basename: WBC, HGB, PLT, LABCREA, CREATININE,  in the last 72 hours Estimated Creatinine Clearance: 55.4 ml/min (by C-G formula based on Cr of 1.17). No results found for this basename: VANCOTROUGH, VANCOPEAK, VANCORANDOM, GENTTROUGH, GENTPEAK, GENTRANDOM, TOBRATROUGH, TOBRAPEAK, TOBRARND, AMIKACINPEAK, AMIKACINTROU, AMIKACIN,  in the last 72 hours   Microbiology: No results found for this or any previous visit (from the past 720 hour(s)).  Medical History: Past Medical History  Diagnosis Date  . Cancer of abdomen   . Gout   . Anxiety   . PE (pulmonary embolism) 2012  . ED (erectile dysfunction)   . Hypercholesteremia   . Cholelithiasis   . Family history of peritoneal cancer     Assessment: 73 y.o. M with progressive appendiceal CA noted to have chills, sweats, and AMS. Pharmacy consulted to start Zosyn for r/o sepsis of possible abdominal source. Will give one dose of Zosyn to load and will f/u with BMET for additional doses based on renal function.  Goal of Therapy:  Proper antibiotics for infection/cultures adjusted for renal/hepatic function   Plan:  1. Zosyn 3.375g IV x 1 dose 2. Will await BMET to determine additional doses 3. Will continue to follow renal function, culture results, LOT, and antibiotic de-escalation plans   Alycia Rossetti, PharmD, BCPS Clinical Pharmacist Pager: [phone] 2013-12-15 8:53 PM      Addendum: CrCl ~20 ml/min, SCr up from baseline.  Will continue with Zosyn 2.25g IV Q8H and monitor SCr for  dose adjustments.    Wynona Neat, PharmD, BCPS 11/13/2013 12:45 AM

## 2013-11-18 NOTE — Progress Notes (Signed)
Pt arrive to [facility] via EMS.  Pt placed on 02 2L [place]  Is lethargic.  Denies pain at this time.  Oriented to room.

## 2013-11-19 ENCOUNTER — Encounter (HOSPITAL_COMMUNITY): Payer: Self-pay | Admitting: *Deleted

## 2013-11-19 LAB — COMPREHENSIVE METABOLIC PANEL
ALK PHOS: 150 U/L — AB (ref 39–117)
ALT: 185 U/L — AB (ref 0–53)
AST: 851 U/L — ABNORMAL HIGH (ref 0–37)
Albumin: 3.1 g/dL — ABNORMAL LOW (ref 3.5–5.2)
BILIRUBIN TOTAL: 1 mg/dL (ref 0.3–1.2)
BUN: 54 mg/dL — AB (ref 6–23)
CO2: 16 meq/L — AB (ref 19–32)
Calcium: 9.6 mg/dL (ref 8.4–10.5)
Chloride: 95 mEq/L — ABNORMAL LOW (ref 96–112)
Creatinine, Ser: 3.11 mg/dL — ABNORMAL HIGH (ref 0.50–1.35)
GFR calc Af Amer: 21 mL/min — ABNORMAL LOW (ref >90)
GFR, EST NON AFRICAN AMERICAN: 19 mL/min — AB (ref >90)
GLUCOSE: 66 mg/dL — AB (ref 70–99)
POTASSIUM: 4.3 meq/L (ref 3.7–5.3)
Sodium: 138 mEq/L (ref 137–147)
Total Protein: 7.7 g/dL (ref 6.0–8.3)

## 2013-11-19 LAB — BASIC METABOLIC PANEL
BUN: 58 mg/dL — ABNORMAL HIGH (ref 6–23)
CO2: 10 mEq/L — CL (ref 19–32)
CREATININE: 3.85 mg/dL — AB (ref 0.50–1.35)
Calcium: 9.2 mg/dL (ref 8.4–10.5)
Chloride: 93 mEq/L — ABNORMAL LOW (ref 96–112)
GFR calc Af Amer: 17 mL/min — ABNORMAL LOW (ref >90)
GFR, EST NON AFRICAN AMERICAN: 14 mL/min — AB (ref >90)
Glucose, Bld: 38 mg/dL — CL (ref 70–99)
Potassium: 5.2 mEq/L (ref 3.7–5.3)
SODIUM: 137 meq/L (ref 137–147)

## 2013-11-19 LAB — CBC
HCT: 42.6 % (ref 39.0–52.0)
Hemoglobin: 13.8 g/dL (ref 13.0–17.0)
MCH: 31.2 pg (ref 26.0–34.0)
MCHC: 32.4 g/dL (ref 30.0–36.0)
MCV: 96.2 fL (ref 78.0–100.0)
Platelets: 271 10*3/uL (ref 150–400)
RBC: 4.43 MIL/uL (ref 4.22–5.81)
RDW: 18.1 % — AB (ref 11.5–15.5)
WBC: 25.7 10*3/uL — AB (ref 4.0–10.5)

## 2013-11-19 LAB — PROTIME-INR
INR: 7.65 — AB (ref 0.00–1.49)
Prothrombin Time: 60.5 seconds — ABNORMAL HIGH (ref 11.6–15.2)

## 2013-11-19 MED ORDER — PIPERACILLIN-TAZOBACTAM IN DEX 2-0.25 GM/50ML IV SOLN
2.2500 g | Freq: Three times a day (TID) | INTRAVENOUS | Status: DC
  Filled 2013-11-19 (×3): qty 50

## 2013-11-19 MED ORDER — SODIUM CHLORIDE 0.9 % IV BOLUS (SEPSIS): 250.0000 mL | Freq: Once | INTRAVENOUS | Status: DC

## 2013-11-19 MED ORDER — VITAMIN K1 10 MG/ML IJ SOLN
5.0000 mg | Freq: Once | INTRAMUSCULAR | Status: DC
Start: 2013-11-19 — End: 2013-11-19
  Filled 2013-11-19: qty 0.5

## 2013-11-19 MED ORDER — ONDANSETRON HCL 4 MG/2ML IJ SOLN: 4.0000 mg | Freq: Three times a day (TID) | INTRAMUSCULAR | Status: DC | PRN

## 2013-11-19 MED ORDER — ONDANSETRON HCL 4 MG/2ML IJ SOLN
4.0000 mg | Freq: Three times a day (TID) | INTRAMUSCULAR | Status: DC | PRN
  Administered 2013-11-19: 4 mg via INTRAVENOUS
  Filled 2013-11-19: qty 2

## 2013-11-21 NOTE — Progress Notes (Signed)
Chaplain gave grief support to pt's wife through caring and compassionate presence and conversation and empathic listening.    December 16, 2013 0700  Clinical Encounter Type  Visited With Patient and family together  Visit Type Spiritual support;Death;Patient actively dying  Spiritual Encounters  Spiritual Needs Emotional;Grief support    Estelle June, chaplain [phone]

## 2013-11-21 NOTE — Progress Notes (Signed)
Unable to get pt's blood pressure,  02 sats in 70's, pt confused and anxious.  Rapid response called.  Rapid response nurse also unable to obtain a blood pressure.  Pt placed on non-rebreather.  Hands and fingers blue in color and cold to touch.  Labs drawn at this time.   MD called regarding PT INR results and unable to get blood pressure.  New orders given.  Pt also has not voided in 10 hours. Bladder scan showed 170cc.  Asked MD if a foley cath to be inserted.  MD did not give an order for foley cath.

## 2013-11-21 NOTE — Significant Event (Signed)
Rapid Response Event Note  Overview: Time Called: 0525 Arrival Time: 0528 Event Type: Respiratory;Hypotension  Initial Focused Assessment: Called by primary Rn for low BP and unable to obtain SPo2.  Upon my arrival to patients room, Rn and family at bedside.  Patient lying in bed with NRB mask. Patient is restless and mottled, extremities cold.     Interventions:  Attempted to obtain manual, hard to distinguish, attempted to doppler BP, faint pulse and hard to hear.  Pulse ox placed on multiple different areas, unable to obtain.  Wife states he did not want any of this and she wants him to be comfortable.  Recommended to Rn to give morphine.  Rn paged MD.  Bonney Roussel paged.  Emotional support given to wife   Event Summary:   at      at          Kennedy Bucker

## 2013-11-21 NOTE — Discharge Summary (Signed)
  DISCHARGE/DEATH SUMMARY  Andrew Shepard  MR#: [id number]  DOB:Mar 05, 1941  Date of Admission: 10/25/2013 Date of Discharge: Dec 15, 2013  Attending Physician:Dezarae Mcclaran [name]  Patient's ALP:[id number],IOXBDZH Antony Haste, MD  Consults: None  Final Diagnoses: Active Problems:   Sepsis Advanced appendiceal carcinomatosis\ Acute on chronic renal failure Supratherapeutic INR Failure to thrive Pleural effusions Hypoxia with respiratory failure Abnormal liver function testing likely due to his cancer                                             Hospital Procedures: Portable Chest 1 View  11/05/2013   CLINICAL DATA:  Dyspnea  EXAM: PORTABLE CHEST - 1 VIEW  COMPARISON:  02/21/2013  FINDINGS: Right IJ porta catheter, tip at the lower SVC.  Normal heart size.  Haziness of the lower chest related to small pleural effusions. Diffuse interstitial coarsening. No asymmetric opacity.  IMPRESSION: Pulmonary edema and small bilateral pleural effusions.   Electronically Signed   By: Jorje Guild M.D.   On: 10/28/2013 22:30    History of Present Illness: Sepsis  Hospital Course: This is 73 year old white male with a history of appendiceal cancer diagnosed approximately 2 years ago. At diagnosis he had caking of the omentum and a large tumor burden. He had a bowel obstruction and had an ostomy placed early in his disease state. He's been for a variety of chemotherapies with remarkable stability until the last few months. He is now had difficulty with weight loss and progressive renal earlier. A CAT scan just last week showed progressive disease despite chemotherapy. He's also been struggling with pleural effusions. He was recently in the hospital at [facility] with pneumonia and pleural effusions. His wife called me I made a home visit last evening and directly admitted him for sepsis. He received broad-spectrum antibiotics but died about 645 this morning. Primary cause of death appears to be  sepsis. No autopsy was requested      Discharge Labs:  Recent Labs  11/17/2013 2300 12-15-13 0500  NA 138 137  K 4.3 5.2  CL 95* 93*  CO2 16* 10*  GLUCOSE 66* 38*  BUN 54* 58*  CREATININE 3.11* 3.85*  CALCIUM 9.6 9.2    Recent Labs  11/10/2013 2300  AST 851*  ALT 185*  ALKPHOS 150*  BILITOT 1.0  PROT 7.7  ALBUMIN 3.1*    Recent Labs  11/01/2013 2300 12/15/2013 0500  WBC 17.0* 25.7*  HGB 14.4 13.8  HCT 42.2 42.6  MCV 93.2 96.2  PLT 233 271   Lab Results  Component Value Date   INR 7.65* 11/18/2013   INR 2.35* 11/26/2012   INR 1.07 10/17/2009          Disposition: To [facility] no autopsy    Signed: Sheela Stack 2013/12/15, 9:27 AM

## 2013-11-21 DEATH — deceased

## 2015-12-02 IMAGING — DX DG CHEST 1V PORT
1 series · 1 of 1 positions shown · non-contrast
Comparison: 02/21/2013

CLINICAL DATA: Dyspnea

EXAM:
PORTABLE CHEST - 1 VIEW

[portable]
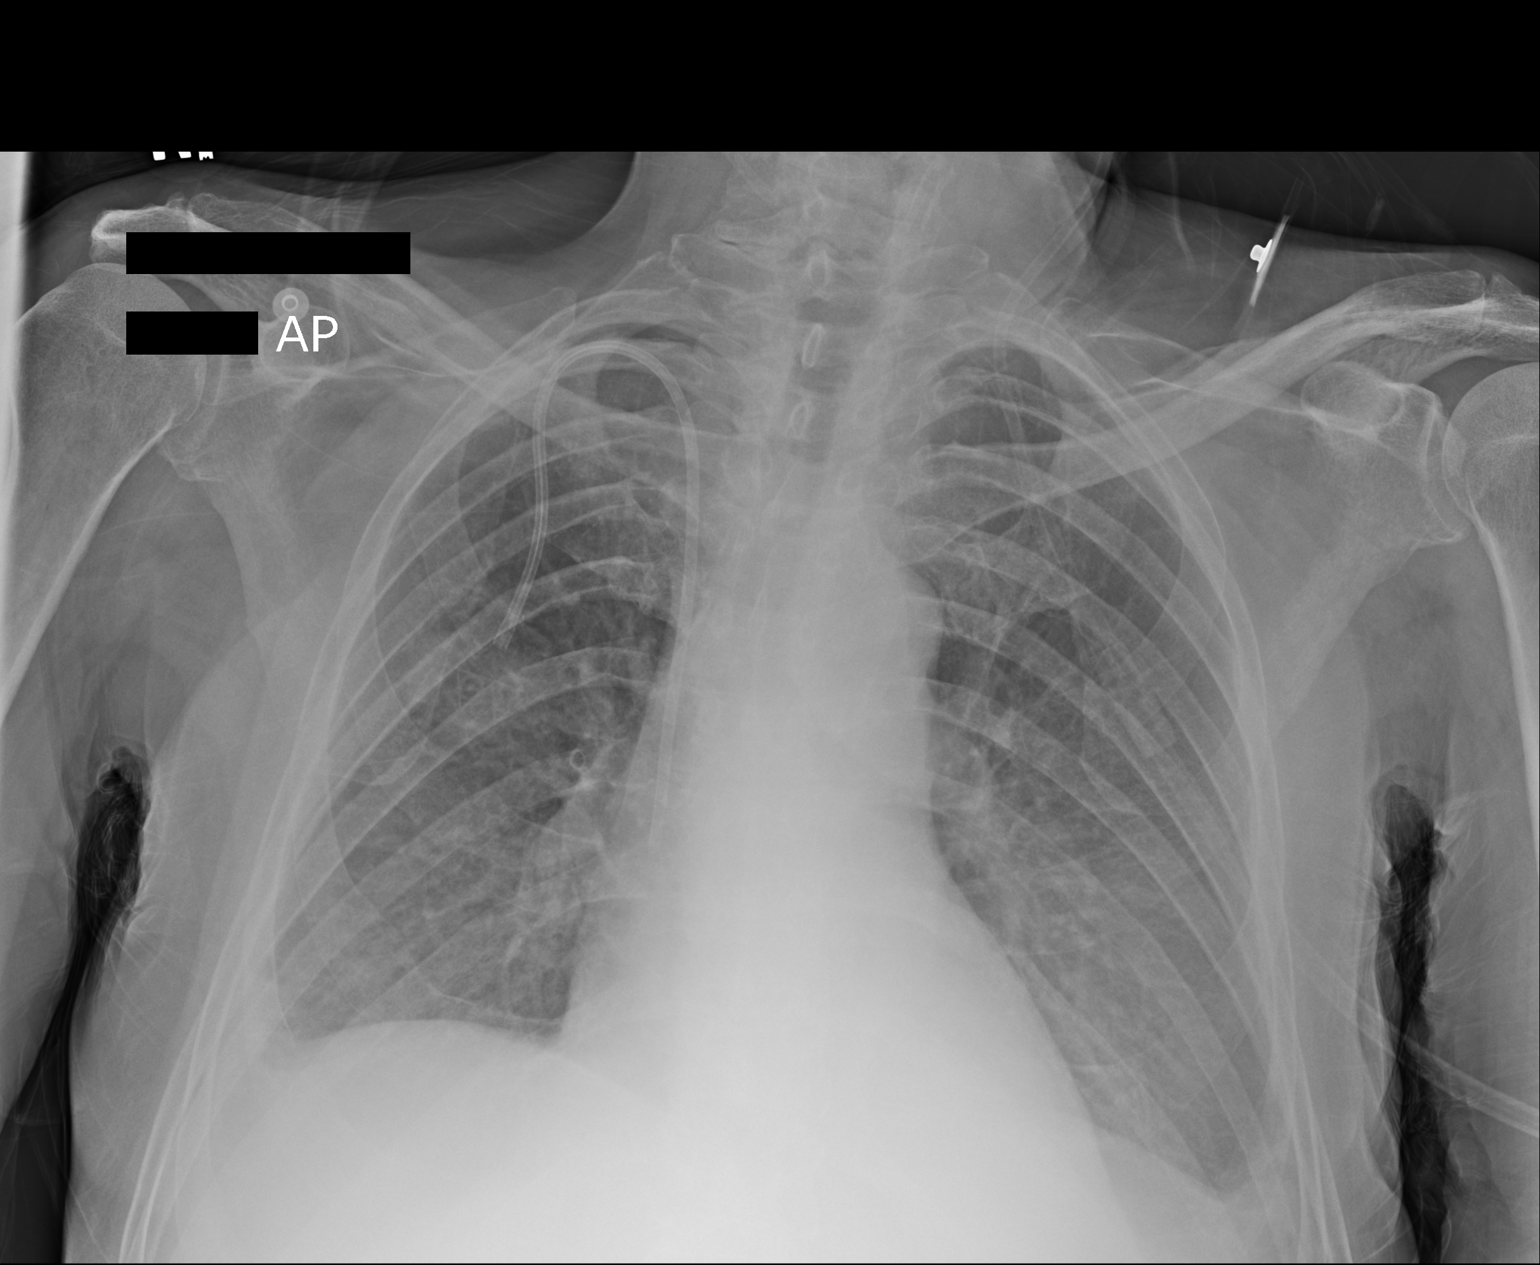

[1 of 1 positions shown; findings below may reference images not displayed]

FINDINGS: Right IJ porta catheter, tip at the lower SVC.  Normal heart size.

Haziness of the lower chest related to small pleural effusions.
Diffuse interstitial coarsening. No asymmetric opacity.
IMPRESSION: Pulmonary edema and small bilateral pleural effusions.
